# Patient Record
Sex: Female | Born: 1935 | Race: Black or African American | Hispanic: No
Health system: Southern US, Community
[De-identification: ages and names within clinical notes are randomized; demographics above are authoritative.]

---

## 2015-10-30 DIAGNOSIS — Z6831 Body mass index (BMI) 31.0-31.9, adult: Secondary | ICD-10-CM | POA: Diagnosis not present

## 2015-10-30 DIAGNOSIS — M810 Age-related osteoporosis without current pathological fracture: Secondary | ICD-10-CM | POA: Diagnosis not present

## 2015-10-30 DIAGNOSIS — I10 Essential (primary) hypertension: Secondary | ICD-10-CM | POA: Diagnosis not present

## 2015-11-04 DIAGNOSIS — M79671 Pain in right foot: Secondary | ICD-10-CM | POA: Diagnosis not present

## 2015-11-04 DIAGNOSIS — M76891 Other specified enthesopathies of right lower limb, excluding foot: Secondary | ICD-10-CM | POA: Diagnosis not present

## 2015-12-07 DIAGNOSIS — M79671 Pain in right foot: Secondary | ICD-10-CM | POA: Diagnosis not present

## 2015-12-07 DIAGNOSIS — G5791 Unspecified mononeuropathy of right lower limb: Secondary | ICD-10-CM | POA: Diagnosis not present

## 2016-02-02 DIAGNOSIS — I10 Essential (primary) hypertension: Secondary | ICD-10-CM | POA: Diagnosis not present

## 2016-02-02 DIAGNOSIS — R5383 Other fatigue: Secondary | ICD-10-CM | POA: Diagnosis not present

## 2016-02-02 DIAGNOSIS — M810 Age-related osteoporosis without current pathological fracture: Secondary | ICD-10-CM | POA: Diagnosis not present

## 2016-02-02 DIAGNOSIS — Z6831 Body mass index (BMI) 31.0-31.9, adult: Secondary | ICD-10-CM | POA: Diagnosis not present

## 2016-02-03 DIAGNOSIS — G4730 Sleep apnea, unspecified: Secondary | ICD-10-CM | POA: Diagnosis not present

## 2016-02-04 DIAGNOSIS — G4730 Sleep apnea, unspecified: Secondary | ICD-10-CM | POA: Diagnosis not present

## 2016-02-05 DIAGNOSIS — G4730 Sleep apnea, unspecified: Secondary | ICD-10-CM | POA: Diagnosis not present

## 2016-03-18 DIAGNOSIS — G5791 Unspecified mononeuropathy of right lower limb: Secondary | ICD-10-CM | POA: Diagnosis not present

## 2016-03-18 DIAGNOSIS — M79671 Pain in right foot: Secondary | ICD-10-CM | POA: Diagnosis not present

## 2016-05-09 DIAGNOSIS — M810 Age-related osteoporosis without current pathological fracture: Secondary | ICD-10-CM | POA: Diagnosis not present

## 2016-05-09 DIAGNOSIS — M25562 Pain in left knee: Secondary | ICD-10-CM | POA: Diagnosis not present

## 2016-05-09 DIAGNOSIS — M175 Other unilateral secondary osteoarthritis of knee: Secondary | ICD-10-CM | POA: Diagnosis not present

## 2016-05-09 DIAGNOSIS — Z6830 Body mass index (BMI) 30.0-30.9, adult: Secondary | ICD-10-CM | POA: Diagnosis not present

## 2016-05-09 DIAGNOSIS — I10 Essential (primary) hypertension: Secondary | ICD-10-CM | POA: Diagnosis not present

## 2016-08-11 DIAGNOSIS — Z1389 Encounter for screening for other disorder: Secondary | ICD-10-CM | POA: Diagnosis not present

## 2016-08-11 DIAGNOSIS — I10 Essential (primary) hypertension: Secondary | ICD-10-CM | POA: Diagnosis not present

## 2016-08-11 DIAGNOSIS — M25562 Pain in left knee: Secondary | ICD-10-CM | POA: Diagnosis not present

## 2016-08-11 DIAGNOSIS — M175 Other unilateral secondary osteoarthritis of knee: Secondary | ICD-10-CM | POA: Diagnosis not present

## 2016-08-11 DIAGNOSIS — Z6830 Body mass index (BMI) 30.0-30.9, adult: Secondary | ICD-10-CM | POA: Diagnosis not present

## 2016-08-11 DIAGNOSIS — M810 Age-related osteoporosis without current pathological fracture: Secondary | ICD-10-CM | POA: Diagnosis not present

## 2016-08-11 DIAGNOSIS — Z0000 Encounter for general adult medical examination without abnormal findings: Secondary | ICD-10-CM | POA: Diagnosis not present

## 2016-11-08 DIAGNOSIS — H2513 Age-related nuclear cataract, bilateral: Secondary | ICD-10-CM | POA: Diagnosis not present

## 2016-11-08 DIAGNOSIS — H25012 Cortical age-related cataract, left eye: Secondary | ICD-10-CM | POA: Diagnosis not present

## 2016-11-17 DIAGNOSIS — F411 Generalized anxiety disorder: Secondary | ICD-10-CM | POA: Diagnosis not present

## 2016-11-17 DIAGNOSIS — M175 Other unilateral secondary osteoarthritis of knee: Secondary | ICD-10-CM | POA: Diagnosis not present

## 2016-11-17 DIAGNOSIS — K210 Gastro-esophageal reflux disease with esophagitis: Secondary | ICD-10-CM | POA: Diagnosis not present

## 2016-11-17 DIAGNOSIS — M25562 Pain in left knee: Secondary | ICD-10-CM | POA: Diagnosis not present

## 2016-11-17 DIAGNOSIS — I10 Essential (primary) hypertension: Secondary | ICD-10-CM | POA: Diagnosis not present

## 2016-11-17 DIAGNOSIS — M810 Age-related osteoporosis without current pathological fracture: Secondary | ICD-10-CM | POA: Diagnosis not present

## 2016-11-17 DIAGNOSIS — Z6831 Body mass index (BMI) 31.0-31.9, adult: Secondary | ICD-10-CM | POA: Diagnosis not present

## 2016-11-17 DIAGNOSIS — Z0000 Encounter for general adult medical examination without abnormal findings: Secondary | ICD-10-CM | POA: Diagnosis not present

## 2016-11-17 DIAGNOSIS — Z13220 Encounter for screening for lipoid disorders: Secondary | ICD-10-CM | POA: Diagnosis not present

## 2016-12-20 DIAGNOSIS — N390 Urinary tract infection, site not specified: Secondary | ICD-10-CM | POA: Diagnosis not present

## 2017-02-17 DIAGNOSIS — M175 Other unilateral secondary osteoarthritis of knee: Secondary | ICD-10-CM | POA: Diagnosis not present

## 2017-02-17 DIAGNOSIS — M810 Age-related osteoporosis without current pathological fracture: Secondary | ICD-10-CM | POA: Diagnosis not present

## 2017-02-17 DIAGNOSIS — K210 Gastro-esophageal reflux disease with esophagitis: Secondary | ICD-10-CM | POA: Diagnosis not present

## 2017-02-17 DIAGNOSIS — M25562 Pain in left knee: Secondary | ICD-10-CM | POA: Diagnosis not present

## 2017-02-17 DIAGNOSIS — F411 Generalized anxiety disorder: Secondary | ICD-10-CM | POA: Diagnosis not present

## 2017-02-17 DIAGNOSIS — Z6831 Body mass index (BMI) 31.0-31.9, adult: Secondary | ICD-10-CM | POA: Diagnosis not present

## 2017-02-17 DIAGNOSIS — I10 Essential (primary) hypertension: Secondary | ICD-10-CM | POA: Diagnosis not present

## 2017-05-29 DIAGNOSIS — M25562 Pain in left knee: Secondary | ICD-10-CM | POA: Diagnosis not present

## 2017-05-29 DIAGNOSIS — F411 Generalized anxiety disorder: Secondary | ICD-10-CM | POA: Diagnosis not present

## 2017-05-29 DIAGNOSIS — K210 Gastro-esophageal reflux disease with esophagitis: Secondary | ICD-10-CM | POA: Diagnosis not present

## 2017-05-29 DIAGNOSIS — Z6831 Body mass index (BMI) 31.0-31.9, adult: Secondary | ICD-10-CM | POA: Diagnosis not present

## 2017-05-29 DIAGNOSIS — M175 Other unilateral secondary osteoarthritis of knee: Secondary | ICD-10-CM | POA: Diagnosis not present

## 2017-05-29 DIAGNOSIS — M810 Age-related osteoporosis without current pathological fracture: Secondary | ICD-10-CM | POA: Diagnosis not present

## 2017-05-29 DIAGNOSIS — I10 Essential (primary) hypertension: Secondary | ICD-10-CM | POA: Diagnosis not present

## 2017-05-31 DIAGNOSIS — N390 Urinary tract infection, site not specified: Secondary | ICD-10-CM | POA: Diagnosis not present

## 2017-08-31 DIAGNOSIS — M25562 Pain in left knee: Secondary | ICD-10-CM | POA: Diagnosis not present

## 2017-08-31 DIAGNOSIS — K210 Gastro-esophageal reflux disease with esophagitis: Secondary | ICD-10-CM | POA: Diagnosis not present

## 2017-08-31 DIAGNOSIS — Z6830 Body mass index (BMI) 30.0-30.9, adult: Secondary | ICD-10-CM | POA: Diagnosis not present

## 2017-08-31 DIAGNOSIS — I10 Essential (primary) hypertension: Secondary | ICD-10-CM | POA: Diagnosis not present

## 2017-08-31 DIAGNOSIS — M810 Age-related osteoporosis without current pathological fracture: Secondary | ICD-10-CM | POA: Diagnosis not present

## 2017-08-31 DIAGNOSIS — F411 Generalized anxiety disorder: Secondary | ICD-10-CM | POA: Diagnosis not present

## 2017-09-18 DIAGNOSIS — M150 Primary generalized (osteo)arthritis: Secondary | ICD-10-CM | POA: Diagnosis not present

## 2017-09-18 DIAGNOSIS — R5383 Other fatigue: Secondary | ICD-10-CM | POA: Diagnosis not present

## 2017-09-18 DIAGNOSIS — Z6829 Body mass index (BMI) 29.0-29.9, adult: Secondary | ICD-10-CM | POA: Diagnosis not present

## 2017-09-18 DIAGNOSIS — E663 Overweight: Secondary | ICD-10-CM | POA: Diagnosis not present

## 2017-09-18 DIAGNOSIS — M810 Age-related osteoporosis without current pathological fracture: Secondary | ICD-10-CM | POA: Diagnosis not present

## 2017-09-18 DIAGNOSIS — M2550 Pain in unspecified joint: Secondary | ICD-10-CM | POA: Diagnosis not present

## 2017-11-04 DIAGNOSIS — M7981 Nontraumatic hematoma of soft tissue: Secondary | ICD-10-CM | POA: Diagnosis not present

## 2017-11-08 DIAGNOSIS — E663 Overweight: Secondary | ICD-10-CM | POA: Diagnosis not present

## 2017-11-08 DIAGNOSIS — M810 Age-related osteoporosis without current pathological fracture: Secondary | ICD-10-CM | POA: Diagnosis not present

## 2017-11-08 DIAGNOSIS — Z6829 Body mass index (BMI) 29.0-29.9, adult: Secondary | ICD-10-CM | POA: Diagnosis not present

## 2017-11-08 DIAGNOSIS — M150 Primary generalized (osteo)arthritis: Secondary | ICD-10-CM | POA: Diagnosis not present

## 2017-11-14 DIAGNOSIS — M25552 Pain in left hip: Secondary | ICD-10-CM | POA: Diagnosis not present

## 2017-11-14 DIAGNOSIS — M150 Primary generalized (osteo)arthritis: Secondary | ICD-10-CM | POA: Diagnosis not present

## 2017-11-14 DIAGNOSIS — M25562 Pain in left knee: Secondary | ICD-10-CM | POA: Diagnosis not present

## 2017-11-14 DIAGNOSIS — M62552 Muscle wasting and atrophy, not elsewhere classified, left thigh: Secondary | ICD-10-CM | POA: Diagnosis not present

## 2017-11-16 DIAGNOSIS — M25552 Pain in left hip: Secondary | ICD-10-CM | POA: Diagnosis not present

## 2017-11-16 DIAGNOSIS — M62552 Muscle wasting and atrophy, not elsewhere classified, left thigh: Secondary | ICD-10-CM | POA: Diagnosis not present

## 2017-11-16 DIAGNOSIS — M25562 Pain in left knee: Secondary | ICD-10-CM | POA: Diagnosis not present

## 2017-11-16 DIAGNOSIS — M150 Primary generalized (osteo)arthritis: Secondary | ICD-10-CM | POA: Diagnosis not present

## 2017-12-15 DIAGNOSIS — M810 Age-related osteoporosis without current pathological fracture: Secondary | ICD-10-CM | POA: Diagnosis not present

## 2017-12-15 DIAGNOSIS — Z6830 Body mass index (BMI) 30.0-30.9, adult: Secondary | ICD-10-CM | POA: Diagnosis not present

## 2017-12-15 DIAGNOSIS — M25562 Pain in left knee: Secondary | ICD-10-CM | POA: Diagnosis not present

## 2017-12-15 DIAGNOSIS — K210 Gastro-esophageal reflux disease with esophagitis: Secondary | ICD-10-CM | POA: Diagnosis not present

## 2017-12-15 DIAGNOSIS — I10 Essential (primary) hypertension: Secondary | ICD-10-CM | POA: Diagnosis not present

## 2017-12-15 DIAGNOSIS — F411 Generalized anxiety disorder: Secondary | ICD-10-CM | POA: Diagnosis not present

## 2017-12-29 DIAGNOSIS — K210 Gastro-esophageal reflux disease with esophagitis: Secondary | ICD-10-CM | POA: Diagnosis not present

## 2017-12-29 DIAGNOSIS — I10 Essential (primary) hypertension: Secondary | ICD-10-CM | POA: Diagnosis not present

## 2017-12-29 DIAGNOSIS — M810 Age-related osteoporosis without current pathological fracture: Secondary | ICD-10-CM | POA: Diagnosis not present

## 2018-03-21 DIAGNOSIS — I10 Essential (primary) hypertension: Secondary | ICD-10-CM | POA: Diagnosis not present

## 2018-03-21 DIAGNOSIS — F411 Generalized anxiety disorder: Secondary | ICD-10-CM | POA: Diagnosis not present

## 2018-03-21 DIAGNOSIS — Z6829 Body mass index (BMI) 29.0-29.9, adult: Secondary | ICD-10-CM | POA: Diagnosis not present

## 2018-03-21 DIAGNOSIS — M25562 Pain in left knee: Secondary | ICD-10-CM | POA: Diagnosis not present

## 2018-03-21 DIAGNOSIS — H671 Otitis media in diseases classified elsewhere, right ear: Secondary | ICD-10-CM | POA: Diagnosis not present

## 2018-03-21 DIAGNOSIS — Z6830 Body mass index (BMI) 30.0-30.9, adult: Secondary | ICD-10-CM | POA: Diagnosis not present

## 2018-03-21 DIAGNOSIS — K210 Gastro-esophageal reflux disease with esophagitis: Secondary | ICD-10-CM | POA: Diagnosis not present

## 2018-03-21 DIAGNOSIS — M810 Age-related osteoporosis without current pathological fracture: Secondary | ICD-10-CM | POA: Diagnosis not present

## 2018-07-04 DIAGNOSIS — I10 Essential (primary) hypertension: Secondary | ICD-10-CM | POA: Diagnosis not present

## 2018-07-04 DIAGNOSIS — K210 Gastro-esophageal reflux disease with esophagitis: Secondary | ICD-10-CM | POA: Diagnosis not present

## 2018-07-04 DIAGNOSIS — Z1389 Encounter for screening for other disorder: Secondary | ICD-10-CM | POA: Diagnosis not present

## 2018-07-04 DIAGNOSIS — F411 Generalized anxiety disorder: Secondary | ICD-10-CM | POA: Diagnosis not present

## 2018-07-04 DIAGNOSIS — Z6827 Body mass index (BMI) 27.0-27.9, adult: Secondary | ICD-10-CM | POA: Diagnosis not present

## 2018-07-04 DIAGNOSIS — Z0000 Encounter for general adult medical examination without abnormal findings: Secondary | ICD-10-CM | POA: Diagnosis not present

## 2018-07-04 DIAGNOSIS — M810 Age-related osteoporosis without current pathological fracture: Secondary | ICD-10-CM | POA: Diagnosis not present

## 2018-08-09 DIAGNOSIS — M85852 Other specified disorders of bone density and structure, left thigh: Secondary | ICD-10-CM | POA: Diagnosis not present

## 2018-08-09 DIAGNOSIS — M810 Age-related osteoporosis without current pathological fracture: Secondary | ICD-10-CM | POA: Diagnosis not present

## 2018-09-18 DIAGNOSIS — I10 Essential (primary) hypertension: Secondary | ICD-10-CM | POA: Diagnosis not present

## 2018-09-18 DIAGNOSIS — H35372 Puckering of macula, left eye: Secondary | ICD-10-CM | POA: Diagnosis not present

## 2018-09-18 DIAGNOSIS — Z01818 Encounter for other preprocedural examination: Secondary | ICD-10-CM | POA: Diagnosis not present

## 2018-09-18 DIAGNOSIS — F411 Generalized anxiety disorder: Secondary | ICD-10-CM | POA: Diagnosis not present

## 2018-09-18 DIAGNOSIS — H25812 Combined forms of age-related cataract, left eye: Secondary | ICD-10-CM | POA: Diagnosis not present

## 2018-09-18 DIAGNOSIS — K210 Gastro-esophageal reflux disease with esophagitis: Secondary | ICD-10-CM | POA: Diagnosis not present

## 2018-09-18 DIAGNOSIS — H25811 Combined forms of age-related cataract, right eye: Secondary | ICD-10-CM | POA: Diagnosis not present

## 2018-10-08 DIAGNOSIS — H2512 Age-related nuclear cataract, left eye: Secondary | ICD-10-CM | POA: Diagnosis not present

## 2018-10-08 DIAGNOSIS — H25812 Combined forms of age-related cataract, left eye: Secondary | ICD-10-CM | POA: Diagnosis not present

## 2018-10-11 DIAGNOSIS — I10 Essential (primary) hypertension: Secondary | ICD-10-CM | POA: Diagnosis not present

## 2018-10-11 DIAGNOSIS — Z6826 Body mass index (BMI) 26.0-26.9, adult: Secondary | ICD-10-CM | POA: Diagnosis not present

## 2018-10-11 DIAGNOSIS — Z1389 Encounter for screening for other disorder: Secondary | ICD-10-CM | POA: Diagnosis not present

## 2018-10-11 DIAGNOSIS — Z0000 Encounter for general adult medical examination without abnormal findings: Secondary | ICD-10-CM | POA: Diagnosis not present

## 2018-12-13 DIAGNOSIS — H25811 Combined forms of age-related cataract, right eye: Secondary | ICD-10-CM | POA: Diagnosis not present

## 2018-12-13 DIAGNOSIS — H2511 Age-related nuclear cataract, right eye: Secondary | ICD-10-CM | POA: Diagnosis not present

## 2018-12-21 DIAGNOSIS — H2511 Age-related nuclear cataract, right eye: Secondary | ICD-10-CM | POA: Diagnosis not present

## 2019-03-20 DIAGNOSIS — M810 Age-related osteoporosis without current pathological fracture: Secondary | ICD-10-CM | POA: Diagnosis not present

## 2019-03-20 DIAGNOSIS — I10 Essential (primary) hypertension: Secondary | ICD-10-CM | POA: Diagnosis not present

## 2019-04-16 DIAGNOSIS — F419 Anxiety disorder, unspecified: Secondary | ICD-10-CM | POA: Diagnosis not present

## 2019-04-16 DIAGNOSIS — E785 Hyperlipidemia, unspecified: Secondary | ICD-10-CM | POA: Diagnosis not present

## 2019-04-16 DIAGNOSIS — Z6826 Body mass index (BMI) 26.0-26.9, adult: Secondary | ICD-10-CM | POA: Diagnosis not present

## 2019-04-16 DIAGNOSIS — I10 Essential (primary) hypertension: Secondary | ICD-10-CM | POA: Diagnosis not present

## 2019-05-31 DIAGNOSIS — N390 Urinary tract infection, site not specified: Secondary | ICD-10-CM | POA: Diagnosis not present

## 2019-06-21 DIAGNOSIS — N958 Other specified menopausal and perimenopausal disorders: Secondary | ICD-10-CM | POA: Diagnosis not present

## 2019-06-21 DIAGNOSIS — N760 Acute vaginitis: Secondary | ICD-10-CM | POA: Diagnosis not present

## 2019-07-18 DIAGNOSIS — Z1389 Encounter for screening for other disorder: Secondary | ICD-10-CM | POA: Diagnosis not present

## 2019-07-18 DIAGNOSIS — M25562 Pain in left knee: Secondary | ICD-10-CM | POA: Diagnosis not present

## 2019-07-18 DIAGNOSIS — I10 Essential (primary) hypertension: Secondary | ICD-10-CM | POA: Diagnosis not present

## 2019-07-18 DIAGNOSIS — Z0000 Encounter for general adult medical examination without abnormal findings: Secondary | ICD-10-CM | POA: Diagnosis not present

## 2019-07-18 DIAGNOSIS — E782 Mixed hyperlipidemia: Secondary | ICD-10-CM | POA: Diagnosis not present

## 2019-07-18 DIAGNOSIS — Z6827 Body mass index (BMI) 27.0-27.9, adult: Secondary | ICD-10-CM | POA: Diagnosis not present

## 2019-07-18 DIAGNOSIS — F418 Other specified anxiety disorders: Secondary | ICD-10-CM | POA: Diagnosis not present

## 2019-09-13 DIAGNOSIS — M79676 Pain in unspecified toe(s): Secondary | ICD-10-CM | POA: Diagnosis not present

## 2019-09-13 DIAGNOSIS — B351 Tinea unguium: Secondary | ICD-10-CM | POA: Diagnosis not present

## 2019-10-28 DIAGNOSIS — Z6826 Body mass index (BMI) 26.0-26.9, adult: Secondary | ICD-10-CM | POA: Diagnosis not present

## 2019-10-28 DIAGNOSIS — I10 Essential (primary) hypertension: Secondary | ICD-10-CM | POA: Diagnosis not present

## 2019-10-28 DIAGNOSIS — E782 Mixed hyperlipidemia: Secondary | ICD-10-CM | POA: Diagnosis not present

## 2019-10-28 DIAGNOSIS — M25562 Pain in left knee: Secondary | ICD-10-CM | POA: Diagnosis not present

## 2019-10-28 DIAGNOSIS — F418 Other specified anxiety disorders: Secondary | ICD-10-CM | POA: Diagnosis not present

## 2019-10-28 DIAGNOSIS — M5432 Sciatica, left side: Secondary | ICD-10-CM | POA: Diagnosis not present

## 2019-11-08 DIAGNOSIS — M5432 Sciatica, left side: Secondary | ICD-10-CM | POA: Diagnosis not present

## 2019-11-08 DIAGNOSIS — M47816 Spondylosis without myelopathy or radiculopathy, lumbar region: Secondary | ICD-10-CM | POA: Diagnosis not present

## 2019-11-08 DIAGNOSIS — M545 Low back pain: Secondary | ICD-10-CM | POA: Diagnosis not present

## 2019-11-08 DIAGNOSIS — M48061 Spinal stenosis, lumbar region without neurogenic claudication: Secondary | ICD-10-CM | POA: Diagnosis not present

## 2019-11-13 ENCOUNTER — Other Ambulatory Visit: Payer: Self-pay | Admitting: Internal Medicine

## 2019-11-13 DIAGNOSIS — G8929 Other chronic pain: Secondary | ICD-10-CM

## 2019-11-20 ENCOUNTER — Other Ambulatory Visit: Payer: Self-pay

## 2019-11-20 ENCOUNTER — Ambulatory Visit
Admission: RE | Admit: 2019-11-20 | Discharge: 2019-11-20 | Disposition: A | Discharge status: Home / Self Care | Payer: Medicare PPO | Source: Ambulatory Visit | Attending: Internal Medicine | Admitting: Internal Medicine

## 2019-11-20 DIAGNOSIS — G8929 Other chronic pain: Secondary | ICD-10-CM

## 2019-11-20 DIAGNOSIS — M545 Low back pain, unspecified: Secondary | ICD-10-CM

## 2019-11-20 MED ORDER — METHYLPREDNISOLONE ACETATE 40 MG/ML INJ SUSP (RADIOLOG
120.0000 mg | Freq: Once | INTRAMUSCULAR | Status: AC
  Administered 2019-11-20: 120 mg via EPIDURAL

## 2019-11-20 MED ORDER — IOPAMIDOL (ISOVUE-M 200) INJECTION 41%
1.0000 mL | Freq: Once | INTRAMUSCULAR | Status: AC
  Administered 2019-11-20: 1 mL via EPIDURAL

## 2019-11-20 NOTE — Discharge Instructions (Signed)

## 2020-01-27 DIAGNOSIS — M2550 Pain in unspecified joint: Secondary | ICD-10-CM | POA: Diagnosis not present

## 2020-01-27 DIAGNOSIS — E782 Mixed hyperlipidemia: Secondary | ICD-10-CM | POA: Diagnosis not present

## 2020-01-27 DIAGNOSIS — Z0000 Encounter for general adult medical examination without abnormal findings: Secondary | ICD-10-CM | POA: Diagnosis not present

## 2020-01-27 DIAGNOSIS — M25562 Pain in left knee: Secondary | ICD-10-CM | POA: Diagnosis not present

## 2020-01-27 DIAGNOSIS — F418 Other specified anxiety disorders: Secondary | ICD-10-CM | POA: Diagnosis not present

## 2020-01-27 DIAGNOSIS — I10 Essential (primary) hypertension: Secondary | ICD-10-CM | POA: Diagnosis not present

## 2020-01-27 DIAGNOSIS — M5432 Sciatica, left side: Secondary | ICD-10-CM | POA: Diagnosis not present

## 2020-01-27 DIAGNOSIS — Z6827 Body mass index (BMI) 27.0-27.9, adult: Secondary | ICD-10-CM | POA: Diagnosis not present

## 2020-03-24 DIAGNOSIS — Z6827 Body mass index (BMI) 27.0-27.9, adult: Secondary | ICD-10-CM | POA: Diagnosis not present

## 2020-03-24 DIAGNOSIS — N952 Postmenopausal atrophic vaginitis: Secondary | ICD-10-CM | POA: Diagnosis not present

## 2020-03-24 DIAGNOSIS — N905 Atrophy of vulva: Secondary | ICD-10-CM | POA: Diagnosis not present

## 2020-03-24 DIAGNOSIS — I10 Essential (primary) hypertension: Secondary | ICD-10-CM | POA: Diagnosis not present

## 2020-04-01 DIAGNOSIS — H903 Sensorineural hearing loss, bilateral: Secondary | ICD-10-CM | POA: Diagnosis not present

## 2020-04-01 DIAGNOSIS — H9313 Tinnitus, bilateral: Secondary | ICD-10-CM | POA: Diagnosis not present

## 2020-04-04 IMAGING — XA Imaging study
2 series · 2 of 2 positions shown · non-contrast
Comparison: none

CLINICAL DATA: Lumbosacral spondylosis without myelopathy. Left low
back, left hip, and lateral left leg pain. Widespread lumbar disc
and facet degeneration with multilevel spinal and neural foraminal
stenosis.

[Series 1: ortho standard · 1 of 1 slices shown (1 of 2)]
[im 1/1]
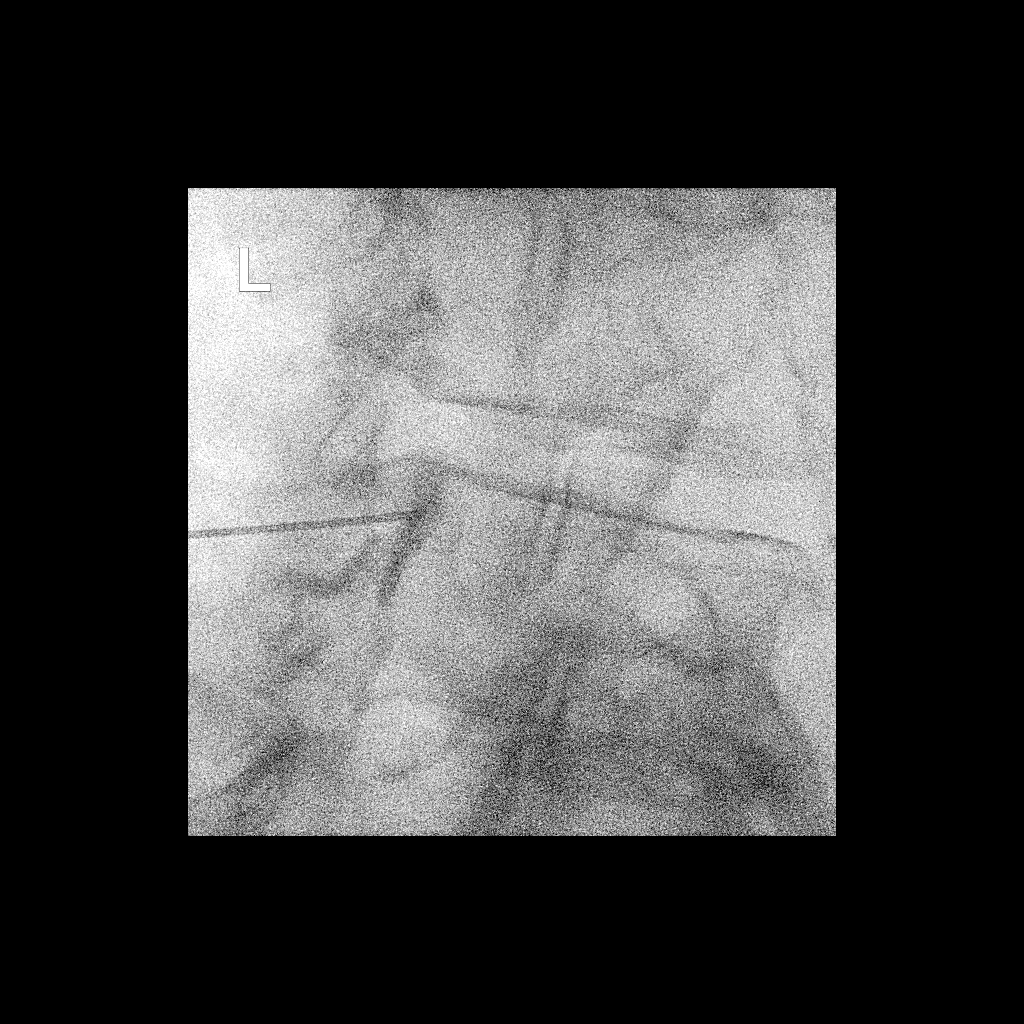

[Series 2: ortho standard · 1 of 1 slices shown (2 of 2)]
[im 1/1]
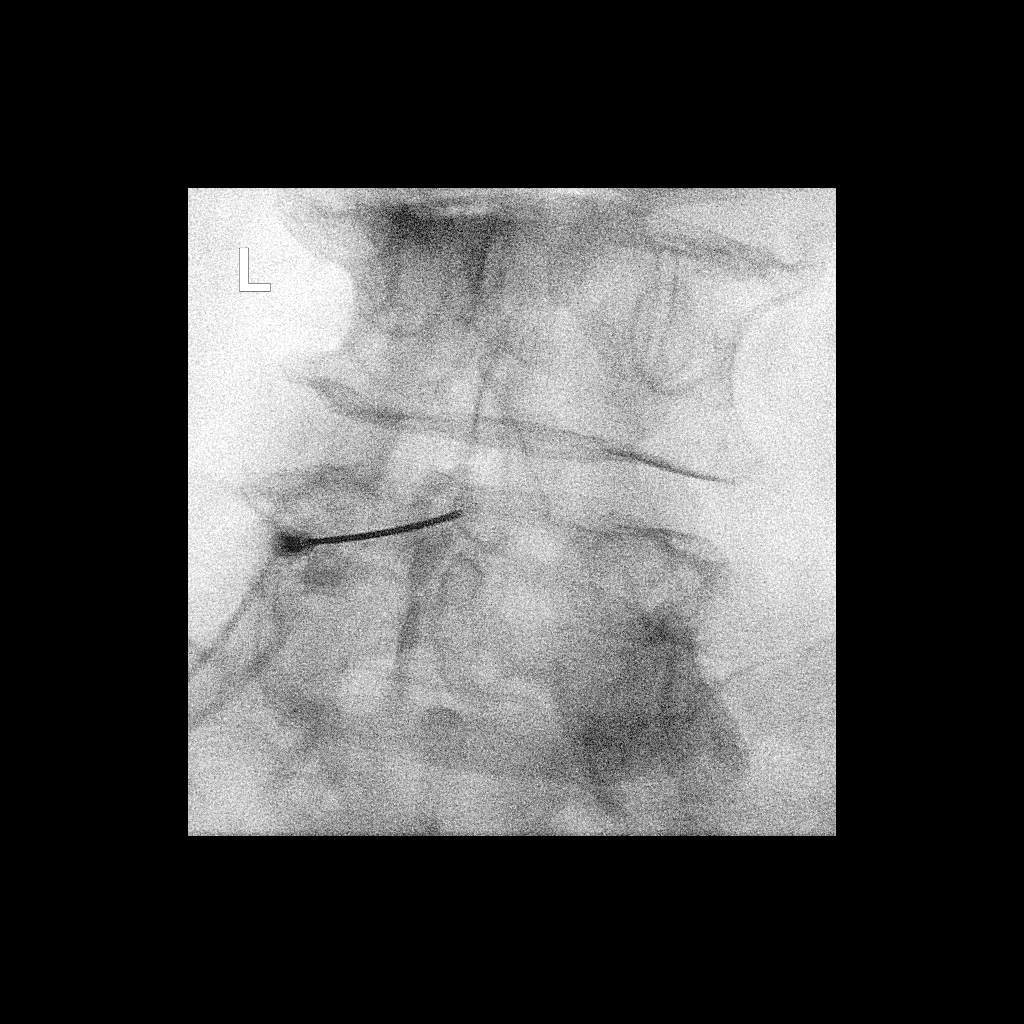

[2 of 2 positions shown; findings below may reference images not displayed]

FLUOROSCOPY TIME:  Fluoroscopy Time: 19 seconds

Radiation Exposure Index: 13.52 microGray*m^2

PROCEDURE:
The procedure, risks, benefits, and alternatives were explained to
the patient. Questions regarding the procedure were encouraged and
answered. The patient understands and consents to the procedure.

LUMBAR EPIDURAL INJECTION:

An interlaminar approach was performed on the left at L4-5. The
overlying skin was cleansed and anesthetized. A 3.5 inch 20 gauge
epidural needle was advanced using loss-of-resistance technique.

DIAGNOSTIC EPIDURAL INJECTION:

Injection of Isovue-M 200 shows a good epidural pattern with spread
above and below the level of needle placement, primarily on the
left. No vascular opacification is seen.

THERAPEUTIC EPIDURAL INJECTION:

120 mg of Depo-Medrol mixed with 3 mL of 1% lidocaine were
instilled. The procedure was well-tolerated, and the patient was
discharged thirty minutes following the injection in good condition.

COMPLICATIONS:
None
IMPRESSION: Technically successful interlaminar epidural injection on the left
at L4-5.

## 2020-04-14 DIAGNOSIS — Z6825 Body mass index (BMI) 25.0-25.9, adult: Secondary | ICD-10-CM | POA: Diagnosis not present

## 2020-04-14 DIAGNOSIS — I10 Essential (primary) hypertension: Secondary | ICD-10-CM | POA: Diagnosis not present

## 2020-05-06 DIAGNOSIS — I10 Essential (primary) hypertension: Secondary | ICD-10-CM | POA: Diagnosis not present

## 2020-05-06 DIAGNOSIS — R5383 Other fatigue: Secondary | ICD-10-CM | POA: Diagnosis not present

## 2020-05-06 DIAGNOSIS — Z6825 Body mass index (BMI) 25.0-25.9, adult: Secondary | ICD-10-CM | POA: Diagnosis not present

## 2020-05-25 DIAGNOSIS — K909 Intestinal malabsorption, unspecified: Secondary | ICD-10-CM | POA: Diagnosis not present

## 2020-05-25 DIAGNOSIS — E611 Iron deficiency: Secondary | ICD-10-CM | POA: Diagnosis not present

## 2020-05-25 DIAGNOSIS — R5383 Other fatigue: Secondary | ICD-10-CM | POA: Diagnosis not present

## 2020-05-25 DIAGNOSIS — R198 Other specified symptoms and signs involving the digestive system and abdomen: Secondary | ICD-10-CM | POA: Diagnosis not present

## 2020-06-09 DIAGNOSIS — Z113 Encounter for screening for infections with a predominantly sexual mode of transmission: Secondary | ICD-10-CM | POA: Diagnosis not present

## 2020-06-09 DIAGNOSIS — N760 Acute vaginitis: Secondary | ICD-10-CM | POA: Diagnosis not present

## 2020-06-09 DIAGNOSIS — N952 Postmenopausal atrophic vaginitis: Secondary | ICD-10-CM | POA: Diagnosis not present

## 2020-06-15 DIAGNOSIS — E210 Primary hyperparathyroidism: Secondary | ICD-10-CM | POA: Diagnosis not present

## 2020-06-15 DIAGNOSIS — R5383 Other fatigue: Secondary | ICD-10-CM | POA: Diagnosis not present

## 2020-06-16 DIAGNOSIS — R5383 Other fatigue: Secondary | ICD-10-CM | POA: Diagnosis not present

## 2020-07-03 DIAGNOSIS — E210 Primary hyperparathyroidism: Secondary | ICD-10-CM | POA: Diagnosis not present

## 2020-07-03 DIAGNOSIS — E039 Hypothyroidism, unspecified: Secondary | ICD-10-CM | POA: Diagnosis not present

## 2020-07-13 DIAGNOSIS — N761 Subacute and chronic vaginitis: Secondary | ICD-10-CM | POA: Diagnosis not present

## 2020-07-13 DIAGNOSIS — Z113 Encounter for screening for infections with a predominantly sexual mode of transmission: Secondary | ICD-10-CM | POA: Diagnosis not present

## 2020-08-10 DIAGNOSIS — I10 Essential (primary) hypertension: Secondary | ICD-10-CM | POA: Diagnosis not present

## 2020-08-10 DIAGNOSIS — Z1331 Encounter for screening for depression: Secondary | ICD-10-CM | POA: Diagnosis not present

## 2020-08-10 DIAGNOSIS — Z6825 Body mass index (BMI) 25.0-25.9, adult: Secondary | ICD-10-CM | POA: Diagnosis not present

## 2020-08-10 DIAGNOSIS — Z0000 Encounter for general adult medical examination without abnormal findings: Secondary | ICD-10-CM | POA: Diagnosis not present

## 2020-08-10 DIAGNOSIS — R5383 Other fatigue: Secondary | ICD-10-CM | POA: Diagnosis not present

## 2020-08-10 DIAGNOSIS — F411 Generalized anxiety disorder: Secondary | ICD-10-CM | POA: Diagnosis not present

## 2020-08-27 DIAGNOSIS — N951 Menopausal and female climacteric states: Secondary | ICD-10-CM | POA: Diagnosis not present

## 2020-08-27 DIAGNOSIS — Z719 Counseling, unspecified: Secondary | ICD-10-CM | POA: Diagnosis not present

## 2020-08-27 DIAGNOSIS — Z2821 Immunization not carried out because of patient refusal: Secondary | ICD-10-CM | POA: Diagnosis not present

## 2020-08-27 DIAGNOSIS — N898 Other specified noninflammatory disorders of vagina: Secondary | ICD-10-CM | POA: Diagnosis not present

## 2020-09-30 DIAGNOSIS — M533 Sacrococcygeal disorders, not elsewhere classified: Secondary | ICD-10-CM | POA: Diagnosis not present

## 2020-09-30 DIAGNOSIS — M7062 Trochanteric bursitis, left hip: Secondary | ICD-10-CM | POA: Diagnosis not present

## 2020-09-30 DIAGNOSIS — M79606 Pain in leg, unspecified: Secondary | ICD-10-CM | POA: Diagnosis not present

## 2020-09-30 DIAGNOSIS — M48062 Spinal stenosis, lumbar region with neurogenic claudication: Secondary | ICD-10-CM | POA: Diagnosis not present

## 2020-10-13 DIAGNOSIS — E559 Vitamin D deficiency, unspecified: Secondary | ICD-10-CM | POA: Diagnosis not present

## 2020-10-13 DIAGNOSIS — R5383 Other fatigue: Secondary | ICD-10-CM | POA: Diagnosis not present

## 2020-10-22 DIAGNOSIS — M7062 Trochanteric bursitis, left hip: Secondary | ICD-10-CM | POA: Diagnosis not present

## 2020-10-22 DIAGNOSIS — M533 Sacrococcygeal disorders, not elsewhere classified: Secondary | ICD-10-CM | POA: Diagnosis not present

## 2020-10-29 DIAGNOSIS — M6281 Muscle weakness (generalized): Secondary | ICD-10-CM | POA: Diagnosis not present

## 2020-10-29 DIAGNOSIS — R262 Difficulty in walking, not elsewhere classified: Secondary | ICD-10-CM | POA: Diagnosis not present

## 2020-10-29 DIAGNOSIS — M48062 Spinal stenosis, lumbar region with neurogenic claudication: Secondary | ICD-10-CM | POA: Diagnosis not present

## 2020-11-02 DIAGNOSIS — R262 Difficulty in walking, not elsewhere classified: Secondary | ICD-10-CM | POA: Diagnosis not present

## 2020-11-02 DIAGNOSIS — M6281 Muscle weakness (generalized): Secondary | ICD-10-CM | POA: Diagnosis not present

## 2020-11-02 DIAGNOSIS — M48062 Spinal stenosis, lumbar region with neurogenic claudication: Secondary | ICD-10-CM | POA: Diagnosis not present

## 2020-11-04 DIAGNOSIS — M6281 Muscle weakness (generalized): Secondary | ICD-10-CM | POA: Diagnosis not present

## 2020-11-04 DIAGNOSIS — R262 Difficulty in walking, not elsewhere classified: Secondary | ICD-10-CM | POA: Diagnosis not present

## 2020-11-04 DIAGNOSIS — M48062 Spinal stenosis, lumbar region with neurogenic claudication: Secondary | ICD-10-CM | POA: Diagnosis not present

## 2020-11-11 DIAGNOSIS — M48062 Spinal stenosis, lumbar region with neurogenic claudication: Secondary | ICD-10-CM | POA: Diagnosis not present

## 2020-11-11 DIAGNOSIS — M6281 Muscle weakness (generalized): Secondary | ICD-10-CM | POA: Diagnosis not present

## 2020-11-11 DIAGNOSIS — R262 Difficulty in walking, not elsewhere classified: Secondary | ICD-10-CM | POA: Diagnosis not present

## 2020-11-16 DIAGNOSIS — M48062 Spinal stenosis, lumbar region with neurogenic claudication: Secondary | ICD-10-CM | POA: Diagnosis not present

## 2020-11-16 DIAGNOSIS — R262 Difficulty in walking, not elsewhere classified: Secondary | ICD-10-CM | POA: Diagnosis not present

## 2020-11-16 DIAGNOSIS — M6281 Muscle weakness (generalized): Secondary | ICD-10-CM | POA: Diagnosis not present

## 2020-11-18 DIAGNOSIS — M48062 Spinal stenosis, lumbar region with neurogenic claudication: Secondary | ICD-10-CM | POA: Diagnosis not present

## 2020-11-18 DIAGNOSIS — R262 Difficulty in walking, not elsewhere classified: Secondary | ICD-10-CM | POA: Diagnosis not present

## 2020-11-18 DIAGNOSIS — M6281 Muscle weakness (generalized): Secondary | ICD-10-CM | POA: Diagnosis not present

## 2020-11-23 DIAGNOSIS — M6281 Muscle weakness (generalized): Secondary | ICD-10-CM | POA: Diagnosis not present

## 2020-11-23 DIAGNOSIS — R262 Difficulty in walking, not elsewhere classified: Secondary | ICD-10-CM | POA: Diagnosis not present

## 2020-11-23 DIAGNOSIS — M48062 Spinal stenosis, lumbar region with neurogenic claudication: Secondary | ICD-10-CM | POA: Diagnosis not present

## 2020-11-25 DIAGNOSIS — R262 Difficulty in walking, not elsewhere classified: Secondary | ICD-10-CM | POA: Diagnosis not present

## 2020-11-25 DIAGNOSIS — M48062 Spinal stenosis, lumbar region with neurogenic claudication: Secondary | ICD-10-CM | POA: Diagnosis not present

## 2020-11-25 DIAGNOSIS — M6281 Muscle weakness (generalized): Secondary | ICD-10-CM | POA: Diagnosis not present

## 2020-11-26 DIAGNOSIS — M7062 Trochanteric bursitis, left hip: Secondary | ICD-10-CM | POA: Diagnosis not present

## 2020-11-26 DIAGNOSIS — M533 Sacrococcygeal disorders, not elsewhere classified: Secondary | ICD-10-CM | POA: Diagnosis not present

## 2020-11-30 DIAGNOSIS — M48062 Spinal stenosis, lumbar region with neurogenic claudication: Secondary | ICD-10-CM | POA: Diagnosis not present

## 2020-11-30 DIAGNOSIS — R262 Difficulty in walking, not elsewhere classified: Secondary | ICD-10-CM | POA: Diagnosis not present

## 2020-11-30 DIAGNOSIS — M6281 Muscle weakness (generalized): Secondary | ICD-10-CM | POA: Diagnosis not present

## 2020-12-01 DIAGNOSIS — I10 Essential (primary) hypertension: Secondary | ICD-10-CM | POA: Diagnosis not present

## 2020-12-01 DIAGNOSIS — Z6825 Body mass index (BMI) 25.0-25.9, adult: Secondary | ICD-10-CM | POA: Diagnosis not present

## 2020-12-01 DIAGNOSIS — F411 Generalized anxiety disorder: Secondary | ICD-10-CM | POA: Diagnosis not present

## 2020-12-01 DIAGNOSIS — F330 Major depressive disorder, recurrent, mild: Secondary | ICD-10-CM | POA: Diagnosis not present

## 2020-12-02 DIAGNOSIS — R262 Difficulty in walking, not elsewhere classified: Secondary | ICD-10-CM | POA: Diagnosis not present

## 2020-12-02 DIAGNOSIS — M6281 Muscle weakness (generalized): Secondary | ICD-10-CM | POA: Diagnosis not present

## 2020-12-02 DIAGNOSIS — M48062 Spinal stenosis, lumbar region with neurogenic claudication: Secondary | ICD-10-CM | POA: Diagnosis not present

## 2020-12-07 DIAGNOSIS — M6281 Muscle weakness (generalized): Secondary | ICD-10-CM | POA: Diagnosis not present

## 2020-12-07 DIAGNOSIS — M48062 Spinal stenosis, lumbar region with neurogenic claudication: Secondary | ICD-10-CM | POA: Diagnosis not present

## 2020-12-07 DIAGNOSIS — R262 Difficulty in walking, not elsewhere classified: Secondary | ICD-10-CM | POA: Diagnosis not present

## 2020-12-09 DIAGNOSIS — R262 Difficulty in walking, not elsewhere classified: Secondary | ICD-10-CM | POA: Diagnosis not present

## 2020-12-09 DIAGNOSIS — M48062 Spinal stenosis, lumbar region with neurogenic claudication: Secondary | ICD-10-CM | POA: Diagnosis not present

## 2020-12-09 DIAGNOSIS — M6281 Muscle weakness (generalized): Secondary | ICD-10-CM | POA: Diagnosis not present

## 2020-12-14 DIAGNOSIS — R262 Difficulty in walking, not elsewhere classified: Secondary | ICD-10-CM | POA: Diagnosis not present

## 2020-12-14 DIAGNOSIS — M48062 Spinal stenosis, lumbar region with neurogenic claudication: Secondary | ICD-10-CM | POA: Diagnosis not present

## 2020-12-14 DIAGNOSIS — M6281 Muscle weakness (generalized): Secondary | ICD-10-CM | POA: Diagnosis not present

## 2020-12-16 DIAGNOSIS — M6281 Muscle weakness (generalized): Secondary | ICD-10-CM | POA: Diagnosis not present

## 2020-12-16 DIAGNOSIS — R262 Difficulty in walking, not elsewhere classified: Secondary | ICD-10-CM | POA: Diagnosis not present

## 2020-12-16 DIAGNOSIS — M48062 Spinal stenosis, lumbar region with neurogenic claudication: Secondary | ICD-10-CM | POA: Diagnosis not present

## 2021-02-01 DIAGNOSIS — E611 Iron deficiency: Secondary | ICD-10-CM | POA: Diagnosis not present

## 2021-02-01 DIAGNOSIS — E559 Vitamin D deficiency, unspecified: Secondary | ICD-10-CM | POA: Diagnosis not present

## 2021-02-01 DIAGNOSIS — R5383 Other fatigue: Secondary | ICD-10-CM | POA: Diagnosis not present

## 2021-02-23 DIAGNOSIS — M1712 Unilateral primary osteoarthritis, left knee: Secondary | ICD-10-CM | POA: Diagnosis not present

## 2021-02-23 DIAGNOSIS — G8929 Other chronic pain: Secondary | ICD-10-CM | POA: Diagnosis not present

## 2021-02-23 DIAGNOSIS — M25562 Pain in left knee: Secondary | ICD-10-CM | POA: Diagnosis not present

## 2021-02-23 DIAGNOSIS — M533 Sacrococcygeal disorders, not elsewhere classified: Secondary | ICD-10-CM | POA: Diagnosis not present

## 2021-02-23 DIAGNOSIS — M7062 Trochanteric bursitis, left hip: Secondary | ICD-10-CM | POA: Diagnosis not present

## 2021-03-04 DIAGNOSIS — F411 Generalized anxiety disorder: Secondary | ICD-10-CM | POA: Diagnosis not present

## 2021-03-04 DIAGNOSIS — Z6826 Body mass index (BMI) 26.0-26.9, adult: Secondary | ICD-10-CM | POA: Diagnosis not present

## 2021-03-04 DIAGNOSIS — I10 Essential (primary) hypertension: Secondary | ICD-10-CM | POA: Diagnosis not present

## 2021-03-04 DIAGNOSIS — Z0000 Encounter for general adult medical examination without abnormal findings: Secondary | ICD-10-CM | POA: Diagnosis not present

## 2021-03-04 DIAGNOSIS — F330 Major depressive disorder, recurrent, mild: Secondary | ICD-10-CM | POA: Diagnosis not present

## 2021-03-08 DIAGNOSIS — M85851 Other specified disorders of bone density and structure, right thigh: Secondary | ICD-10-CM | POA: Diagnosis not present

## 2021-03-08 DIAGNOSIS — E2839 Other primary ovarian failure: Secondary | ICD-10-CM | POA: Diagnosis not present

## 2021-03-08 DIAGNOSIS — M810 Age-related osteoporosis without current pathological fracture: Secondary | ICD-10-CM | POA: Diagnosis not present

## 2021-03-22 DIAGNOSIS — F411 Generalized anxiety disorder: Secondary | ICD-10-CM | POA: Diagnosis not present

## 2021-03-22 DIAGNOSIS — I10 Essential (primary) hypertension: Secondary | ICD-10-CM | POA: Diagnosis not present

## 2021-03-22 DIAGNOSIS — F330 Major depressive disorder, recurrent, mild: Secondary | ICD-10-CM | POA: Diagnosis not present

## 2021-05-25 DIAGNOSIS — M25562 Pain in left knee: Secondary | ICD-10-CM | POA: Diagnosis not present

## 2021-06-09 DIAGNOSIS — Z6825 Body mass index (BMI) 25.0-25.9, adult: Secondary | ICD-10-CM | POA: Diagnosis not present

## 2021-06-09 DIAGNOSIS — F330 Major depressive disorder, recurrent, mild: Secondary | ICD-10-CM | POA: Diagnosis not present

## 2021-06-09 DIAGNOSIS — E782 Mixed hyperlipidemia: Secondary | ICD-10-CM | POA: Diagnosis not present

## 2021-06-09 DIAGNOSIS — F411 Generalized anxiety disorder: Secondary | ICD-10-CM | POA: Diagnosis not present

## 2021-06-09 DIAGNOSIS — I10 Essential (primary) hypertension: Secondary | ICD-10-CM | POA: Diagnosis not present

## 2021-06-14 DIAGNOSIS — E559 Vitamin D deficiency, unspecified: Secondary | ICD-10-CM | POA: Diagnosis not present

## 2021-06-14 DIAGNOSIS — E611 Iron deficiency: Secondary | ICD-10-CM | POA: Diagnosis not present

## 2021-06-23 DIAGNOSIS — E782 Mixed hyperlipidemia: Secondary | ICD-10-CM | POA: Diagnosis not present

## 2021-06-23 DIAGNOSIS — F411 Generalized anxiety disorder: Secondary | ICD-10-CM | POA: Diagnosis not present

## 2021-06-23 DIAGNOSIS — I10 Essential (primary) hypertension: Secondary | ICD-10-CM | POA: Diagnosis not present

## 2021-06-23 DIAGNOSIS — F330 Major depressive disorder, recurrent, mild: Secondary | ICD-10-CM | POA: Diagnosis not present

## 2021-07-07 DIAGNOSIS — M79605 Pain in left leg: Secondary | ICD-10-CM | POA: Diagnosis not present

## 2021-07-07 DIAGNOSIS — M79604 Pain in right leg: Secondary | ICD-10-CM | POA: Diagnosis not present

## 2021-07-07 DIAGNOSIS — R600 Localized edema: Secondary | ICD-10-CM | POA: Diagnosis not present

## 2021-07-28 DIAGNOSIS — H524 Presbyopia: Secondary | ICD-10-CM | POA: Diagnosis not present

## 2021-07-28 DIAGNOSIS — H40053 Ocular hypertension, bilateral: Secondary | ICD-10-CM | POA: Diagnosis not present

## 2021-08-10 DIAGNOSIS — I87323 Chronic venous hypertension (idiopathic) with inflammation of bilateral lower extremity: Secondary | ICD-10-CM | POA: Diagnosis not present

## 2021-09-02 DIAGNOSIS — E782 Mixed hyperlipidemia: Secondary | ICD-10-CM | POA: Diagnosis not present

## 2021-09-02 DIAGNOSIS — I10 Essential (primary) hypertension: Secondary | ICD-10-CM | POA: Diagnosis not present

## 2021-09-02 DIAGNOSIS — F411 Generalized anxiety disorder: Secondary | ICD-10-CM | POA: Diagnosis not present

## 2021-09-02 DIAGNOSIS — F330 Major depressive disorder, recurrent, mild: Secondary | ICD-10-CM | POA: Diagnosis not present

## 2021-09-02 DIAGNOSIS — Z0000 Encounter for general adult medical examination without abnormal findings: Secondary | ICD-10-CM | POA: Diagnosis not present

## 2021-09-02 DIAGNOSIS — Z6826 Body mass index (BMI) 26.0-26.9, adult: Secondary | ICD-10-CM | POA: Diagnosis not present

## 2021-09-22 DIAGNOSIS — I83891 Varicose veins of right lower extremities with other complications: Secondary | ICD-10-CM | POA: Diagnosis not present

## 2021-09-22 DIAGNOSIS — I872 Venous insufficiency (chronic) (peripheral): Secondary | ICD-10-CM | POA: Diagnosis not present

## 2021-09-28 DIAGNOSIS — Z09 Encounter for follow-up examination after completed treatment for conditions other than malignant neoplasm: Secondary | ICD-10-CM | POA: Diagnosis not present

## 2021-09-28 DIAGNOSIS — I872 Venous insufficiency (chronic) (peripheral): Secondary | ICD-10-CM | POA: Diagnosis not present

## 2021-09-28 DIAGNOSIS — I83811 Varicose veins of right lower extremities with pain: Secondary | ICD-10-CM | POA: Diagnosis not present

## 2021-09-28 DIAGNOSIS — I83892 Varicose veins of left lower extremities with other complications: Secondary | ICD-10-CM | POA: Diagnosis not present

## 2021-10-04 DIAGNOSIS — I83812 Varicose veins of left lower extremities with pain: Secondary | ICD-10-CM | POA: Diagnosis not present

## 2021-10-04 DIAGNOSIS — Z09 Encounter for follow-up examination after completed treatment for conditions other than malignant neoplasm: Secondary | ICD-10-CM | POA: Diagnosis not present

## 2021-10-11 DIAGNOSIS — E611 Iron deficiency: Secondary | ICD-10-CM | POA: Diagnosis not present

## 2021-10-11 DIAGNOSIS — E559 Vitamin D deficiency, unspecified: Secondary | ICD-10-CM | POA: Diagnosis not present

## 2021-10-11 DIAGNOSIS — R6889 Other general symptoms and signs: Secondary | ICD-10-CM | POA: Diagnosis not present

## 2021-10-27 DIAGNOSIS — I872 Venous insufficiency (chronic) (peripheral): Secondary | ICD-10-CM | POA: Diagnosis not present

## 2021-10-27 DIAGNOSIS — I87303 Chronic venous hypertension (idiopathic) without complications of bilateral lower extremity: Secondary | ICD-10-CM | POA: Diagnosis not present

## 2021-11-23 DIAGNOSIS — I10 Essential (primary) hypertension: Secondary | ICD-10-CM | POA: Diagnosis not present

## 2021-11-23 DIAGNOSIS — F330 Major depressive disorder, recurrent, mild: Secondary | ICD-10-CM | POA: Diagnosis not present

## 2021-11-23 DIAGNOSIS — F411 Generalized anxiety disorder: Secondary | ICD-10-CM | POA: Diagnosis not present

## 2021-11-23 DIAGNOSIS — E782 Mixed hyperlipidemia: Secondary | ICD-10-CM | POA: Diagnosis not present

## 2021-12-08 DIAGNOSIS — E782 Mixed hyperlipidemia: Secondary | ICD-10-CM | POA: Diagnosis not present

## 2021-12-08 DIAGNOSIS — Z6826 Body mass index (BMI) 26.0-26.9, adult: Secondary | ICD-10-CM | POA: Diagnosis not present

## 2021-12-08 DIAGNOSIS — I10 Essential (primary) hypertension: Secondary | ICD-10-CM | POA: Diagnosis not present

## 2021-12-08 DIAGNOSIS — F330 Major depressive disorder, recurrent, mild: Secondary | ICD-10-CM | POA: Diagnosis not present

## 2021-12-08 DIAGNOSIS — F411 Generalized anxiety disorder: Secondary | ICD-10-CM | POA: Diagnosis not present

## 2022-02-16 DIAGNOSIS — R6889 Other general symptoms and signs: Secondary | ICD-10-CM | POA: Diagnosis not present

## 2022-02-16 DIAGNOSIS — R5383 Other fatigue: Secondary | ICD-10-CM | POA: Diagnosis not present

## 2022-02-16 DIAGNOSIS — E559 Vitamin D deficiency, unspecified: Secondary | ICD-10-CM | POA: Diagnosis not present

## 2022-02-16 DIAGNOSIS — E611 Iron deficiency: Secondary | ICD-10-CM | POA: Diagnosis not present

## 2022-03-09 DIAGNOSIS — Z6826 Body mass index (BMI) 26.0-26.9, adult: Secondary | ICD-10-CM | POA: Diagnosis not present

## 2022-03-09 DIAGNOSIS — I10 Essential (primary) hypertension: Secondary | ICD-10-CM | POA: Diagnosis not present

## 2022-03-09 DIAGNOSIS — F330 Major depressive disorder, recurrent, mild: Secondary | ICD-10-CM | POA: Diagnosis not present

## 2022-03-09 DIAGNOSIS — F411 Generalized anxiety disorder: Secondary | ICD-10-CM | POA: Diagnosis not present

## 2022-03-09 DIAGNOSIS — E782 Mixed hyperlipidemia: Secondary | ICD-10-CM | POA: Diagnosis not present

## 2022-03-09 DIAGNOSIS — Z0000 Encounter for general adult medical examination without abnormal findings: Secondary | ICD-10-CM | POA: Diagnosis not present

## 2022-03-14 DIAGNOSIS — H40013 Open angle with borderline findings, low risk, bilateral: Secondary | ICD-10-CM | POA: Diagnosis not present

## 2022-03-14 DIAGNOSIS — H524 Presbyopia: Secondary | ICD-10-CM | POA: Diagnosis not present

## 2022-05-25 DIAGNOSIS — E611 Iron deficiency: Secondary | ICD-10-CM | POA: Diagnosis not present

## 2022-05-25 DIAGNOSIS — R6889 Other general symptoms and signs: Secondary | ICD-10-CM | POA: Diagnosis not present

## 2022-05-25 DIAGNOSIS — E559 Vitamin D deficiency, unspecified: Secondary | ICD-10-CM | POA: Diagnosis not present

## 2022-05-31 DIAGNOSIS — I87303 Chronic venous hypertension (idiopathic) without complications of bilateral lower extremity: Secondary | ICD-10-CM | POA: Diagnosis not present

## 2022-05-31 DIAGNOSIS — I87393 Chronic venous hypertension (idiopathic) with other complications of bilateral lower extremity: Secondary | ICD-10-CM | POA: Diagnosis not present

## 2022-06-13 DIAGNOSIS — I10 Essential (primary) hypertension: Secondary | ICD-10-CM | POA: Diagnosis not present

## 2022-06-13 DIAGNOSIS — E782 Mixed hyperlipidemia: Secondary | ICD-10-CM | POA: Diagnosis not present

## 2022-06-13 DIAGNOSIS — F330 Major depressive disorder, recurrent, mild: Secondary | ICD-10-CM | POA: Diagnosis not present

## 2022-06-13 DIAGNOSIS — Z6825 Body mass index (BMI) 25.0-25.9, adult: Secondary | ICD-10-CM | POA: Diagnosis not present

## 2022-06-13 DIAGNOSIS — F411 Generalized anxiety disorder: Secondary | ICD-10-CM | POA: Diagnosis not present

## 2022-08-30 DIAGNOSIS — H40013 Open angle with borderline findings, low risk, bilateral: Secondary | ICD-10-CM | POA: Diagnosis not present

## 2022-09-20 DIAGNOSIS — E782 Mixed hyperlipidemia: Secondary | ICD-10-CM | POA: Diagnosis not present

## 2022-09-20 DIAGNOSIS — Z6825 Body mass index (BMI) 25.0-25.9, adult: Secondary | ICD-10-CM | POA: Diagnosis not present

## 2022-09-20 DIAGNOSIS — F330 Major depressive disorder, recurrent, mild: Secondary | ICD-10-CM | POA: Diagnosis not present

## 2022-09-20 DIAGNOSIS — I10 Essential (primary) hypertension: Secondary | ICD-10-CM | POA: Diagnosis not present

## 2022-09-20 DIAGNOSIS — F411 Generalized anxiety disorder: Secondary | ICD-10-CM | POA: Diagnosis not present

## 2022-12-26 DIAGNOSIS — Z0000 Encounter for general adult medical examination without abnormal findings: Secondary | ICD-10-CM | POA: Diagnosis not present

## 2022-12-26 DIAGNOSIS — F330 Major depressive disorder, recurrent, mild: Secondary | ICD-10-CM | POA: Diagnosis not present

## 2022-12-26 DIAGNOSIS — M150 Primary generalized (osteo)arthritis: Secondary | ICD-10-CM | POA: Diagnosis not present

## 2022-12-26 DIAGNOSIS — E782 Mixed hyperlipidemia: Secondary | ICD-10-CM | POA: Diagnosis not present

## 2022-12-26 DIAGNOSIS — F411 Generalized anxiety disorder: Secondary | ICD-10-CM | POA: Diagnosis not present

## 2022-12-26 DIAGNOSIS — I10 Essential (primary) hypertension: Secondary | ICD-10-CM | POA: Diagnosis not present

## 2022-12-26 DIAGNOSIS — I872 Venous insufficiency (chronic) (peripheral): Secondary | ICD-10-CM | POA: Diagnosis not present

## 2022-12-26 DIAGNOSIS — Z6825 Body mass index (BMI) 25.0-25.9, adult: Secondary | ICD-10-CM | POA: Diagnosis not present

## 2023-03-28 DIAGNOSIS — M150 Primary generalized (osteo)arthritis: Secondary | ICD-10-CM | POA: Diagnosis not present

## 2023-03-28 DIAGNOSIS — I872 Venous insufficiency (chronic) (peripheral): Secondary | ICD-10-CM | POA: Diagnosis not present

## 2023-03-28 DIAGNOSIS — F330 Major depressive disorder, recurrent, mild: Secondary | ICD-10-CM | POA: Diagnosis not present

## 2023-03-28 DIAGNOSIS — F411 Generalized anxiety disorder: Secondary | ICD-10-CM | POA: Diagnosis not present

## 2023-03-28 DIAGNOSIS — Z0000 Encounter for general adult medical examination without abnormal findings: Secondary | ICD-10-CM | POA: Diagnosis not present

## 2023-03-28 DIAGNOSIS — I10 Essential (primary) hypertension: Secondary | ICD-10-CM | POA: Diagnosis not present

## 2023-03-28 DIAGNOSIS — Z6824 Body mass index (BMI) 24.0-24.9, adult: Secondary | ICD-10-CM | POA: Diagnosis not present

## 2023-03-28 DIAGNOSIS — E782 Mixed hyperlipidemia: Secondary | ICD-10-CM | POA: Diagnosis not present

## 2023-08-09 DIAGNOSIS — N1831 Chronic kidney disease, stage 3a: Secondary | ICD-10-CM | POA: Diagnosis not present

## 2023-08-09 DIAGNOSIS — F330 Major depressive disorder, recurrent, mild: Secondary | ICD-10-CM | POA: Diagnosis not present

## 2023-08-09 DIAGNOSIS — F411 Generalized anxiety disorder: Secondary | ICD-10-CM | POA: Diagnosis not present

## 2023-08-09 DIAGNOSIS — E782 Mixed hyperlipidemia: Secondary | ICD-10-CM | POA: Diagnosis not present

## 2023-08-09 DIAGNOSIS — I10 Essential (primary) hypertension: Secondary | ICD-10-CM | POA: Diagnosis not present

## 2023-08-09 DIAGNOSIS — I872 Venous insufficiency (chronic) (peripheral): Secondary | ICD-10-CM | POA: Diagnosis not present

## 2023-08-09 DIAGNOSIS — M150 Primary generalized (osteo)arthritis: Secondary | ICD-10-CM | POA: Diagnosis not present

## 2023-08-09 DIAGNOSIS — Z6824 Body mass index (BMI) 24.0-24.9, adult: Secondary | ICD-10-CM | POA: Diagnosis not present

## 2023-11-08 DIAGNOSIS — M51369 Other intervertebral disc degeneration, lumbar region without mention of lumbar back pain or lower extremity pain: Secondary | ICD-10-CM | POA: Diagnosis not present

## 2023-11-08 DIAGNOSIS — M47814 Spondylosis without myelopathy or radiculopathy, thoracic region: Secondary | ICD-10-CM | POA: Diagnosis not present

## 2023-11-08 DIAGNOSIS — M4185 Other forms of scoliosis, thoracolumbar region: Secondary | ICD-10-CM | POA: Diagnosis not present

## 2023-11-08 DIAGNOSIS — M549 Dorsalgia, unspecified: Secondary | ICD-10-CM | POA: Diagnosis not present

## 2023-11-08 DIAGNOSIS — R102 Pelvic and perineal pain: Secondary | ICD-10-CM | POA: Diagnosis not present

## 2023-11-08 DIAGNOSIS — M5134 Other intervertebral disc degeneration, thoracic region: Secondary | ICD-10-CM | POA: Diagnosis not present

## 2023-11-08 DIAGNOSIS — M47816 Spondylosis without myelopathy or radiculopathy, lumbar region: Secondary | ICD-10-CM | POA: Diagnosis not present

## 2023-11-08 DIAGNOSIS — M160 Bilateral primary osteoarthritis of hip: Secondary | ICD-10-CM | POA: Diagnosis not present

## 2023-11-08 DIAGNOSIS — M419 Scoliosis, unspecified: Secondary | ICD-10-CM | POA: Diagnosis not present

## 2023-11-08 DIAGNOSIS — M438X6 Other specified deforming dorsopathies, lumbar region: Secondary | ICD-10-CM | POA: Diagnosis not present

## 2023-11-08 DIAGNOSIS — M546 Pain in thoracic spine: Secondary | ICD-10-CM | POA: Diagnosis not present

## 2023-11-09 DIAGNOSIS — M170 Bilateral primary osteoarthritis of knee: Secondary | ICD-10-CM | POA: Diagnosis not present

## 2023-11-09 DIAGNOSIS — F330 Major depressive disorder, recurrent, mild: Secondary | ICD-10-CM | POA: Diagnosis not present

## 2023-11-09 DIAGNOSIS — F411 Generalized anxiety disorder: Secondary | ICD-10-CM | POA: Diagnosis not present

## 2023-11-09 DIAGNOSIS — M25561 Pain in right knee: Secondary | ICD-10-CM | POA: Diagnosis not present

## 2023-11-09 DIAGNOSIS — N1831 Chronic kidney disease, stage 3a: Secondary | ICD-10-CM | POA: Diagnosis not present

## 2023-11-09 DIAGNOSIS — M25562 Pain in left knee: Secondary | ICD-10-CM | POA: Diagnosis not present

## 2023-11-09 DIAGNOSIS — M150 Primary generalized (osteo)arthritis: Secondary | ICD-10-CM | POA: Diagnosis not present

## 2023-11-09 DIAGNOSIS — M2392 Unspecified internal derangement of left knee: Secondary | ICD-10-CM | POA: Diagnosis not present

## 2023-11-09 DIAGNOSIS — I872 Venous insufficiency (chronic) (peripheral): Secondary | ICD-10-CM | POA: Diagnosis not present

## 2023-11-09 DIAGNOSIS — Z6824 Body mass index (BMI) 24.0-24.9, adult: Secondary | ICD-10-CM | POA: Diagnosis not present

## 2023-11-09 DIAGNOSIS — I10 Essential (primary) hypertension: Secondary | ICD-10-CM | POA: Diagnosis not present

## 2023-11-09 DIAGNOSIS — E782 Mixed hyperlipidemia: Secondary | ICD-10-CM | POA: Diagnosis not present

## 2023-11-16 DIAGNOSIS — F330 Major depressive disorder, recurrent, mild: Secondary | ICD-10-CM | POA: Diagnosis not present

## 2023-11-16 DIAGNOSIS — M2392 Unspecified internal derangement of left knee: Secondary | ICD-10-CM | POA: Diagnosis not present

## 2023-11-16 DIAGNOSIS — F411 Generalized anxiety disorder: Secondary | ICD-10-CM | POA: Diagnosis not present

## 2023-11-16 DIAGNOSIS — M150 Primary generalized (osteo)arthritis: Secondary | ICD-10-CM | POA: Diagnosis not present

## 2023-11-16 DIAGNOSIS — I872 Venous insufficiency (chronic) (peripheral): Secondary | ICD-10-CM | POA: Diagnosis not present

## 2023-11-16 DIAGNOSIS — I129 Hypertensive chronic kidney disease with stage 1 through stage 4 chronic kidney disease, or unspecified chronic kidney disease: Secondary | ICD-10-CM | POA: Diagnosis not present

## 2023-11-16 DIAGNOSIS — N1831 Chronic kidney disease, stage 3a: Secondary | ICD-10-CM | POA: Diagnosis not present

## 2023-11-23 DIAGNOSIS — F330 Major depressive disorder, recurrent, mild: Secondary | ICD-10-CM | POA: Diagnosis not present

## 2023-11-23 DIAGNOSIS — M2392 Unspecified internal derangement of left knee: Secondary | ICD-10-CM | POA: Diagnosis not present

## 2023-11-23 DIAGNOSIS — N1831 Chronic kidney disease, stage 3a: Secondary | ICD-10-CM | POA: Diagnosis not present

## 2023-11-23 DIAGNOSIS — M150 Primary generalized (osteo)arthritis: Secondary | ICD-10-CM | POA: Diagnosis not present

## 2023-11-23 DIAGNOSIS — I872 Venous insufficiency (chronic) (peripheral): Secondary | ICD-10-CM | POA: Diagnosis not present

## 2023-11-23 DIAGNOSIS — I129 Hypertensive chronic kidney disease with stage 1 through stage 4 chronic kidney disease, or unspecified chronic kidney disease: Secondary | ICD-10-CM | POA: Diagnosis not present

## 2023-12-18 DIAGNOSIS — I83893 Varicose veins of bilateral lower extremities with other complications: Secondary | ICD-10-CM | POA: Diagnosis not present

## 2023-12-18 DIAGNOSIS — I872 Venous insufficiency (chronic) (peripheral): Secondary | ICD-10-CM | POA: Diagnosis not present

## 2023-12-26 DIAGNOSIS — M79604 Pain in right leg: Secondary | ICD-10-CM | POA: Diagnosis not present

## 2023-12-26 DIAGNOSIS — M545 Low back pain, unspecified: Secondary | ICD-10-CM | POA: Diagnosis not present

## 2023-12-26 DIAGNOSIS — M79605 Pain in left leg: Secondary | ICD-10-CM | POA: Diagnosis not present

## 2023-12-26 DIAGNOSIS — R202 Paresthesia of skin: Secondary | ICD-10-CM | POA: Diagnosis not present

## 2023-12-26 DIAGNOSIS — R200 Anesthesia of skin: Secondary | ICD-10-CM | POA: Diagnosis not present

## 2023-12-26 DIAGNOSIS — R29898 Other symptoms and signs involving the musculoskeletal system: Secondary | ICD-10-CM | POA: Diagnosis not present

## 2023-12-28 DIAGNOSIS — R202 Paresthesia of skin: Secondary | ICD-10-CM | POA: Diagnosis not present

## 2023-12-28 DIAGNOSIS — M545 Low back pain, unspecified: Secondary | ICD-10-CM | POA: Diagnosis not present

## 2023-12-28 DIAGNOSIS — R29898 Other symptoms and signs involving the musculoskeletal system: Secondary | ICD-10-CM | POA: Diagnosis not present

## 2023-12-28 DIAGNOSIS — R200 Anesthesia of skin: Secondary | ICD-10-CM | POA: Diagnosis not present

## 2024-01-01 DIAGNOSIS — M545 Low back pain, unspecified: Secondary | ICD-10-CM | POA: Diagnosis not present

## 2024-01-01 DIAGNOSIS — R202 Paresthesia of skin: Secondary | ICD-10-CM | POA: Diagnosis not present

## 2024-01-01 DIAGNOSIS — R29898 Other symptoms and signs involving the musculoskeletal system: Secondary | ICD-10-CM | POA: Diagnosis not present

## 2024-01-01 DIAGNOSIS — R200 Anesthesia of skin: Secondary | ICD-10-CM | POA: Diagnosis not present

## 2024-01-03 DIAGNOSIS — R202 Paresthesia of skin: Secondary | ICD-10-CM | POA: Diagnosis not present

## 2024-01-03 DIAGNOSIS — R200 Anesthesia of skin: Secondary | ICD-10-CM | POA: Diagnosis not present

## 2024-01-03 DIAGNOSIS — R29898 Other symptoms and signs involving the musculoskeletal system: Secondary | ICD-10-CM | POA: Diagnosis not present

## 2024-01-03 DIAGNOSIS — M545 Low back pain, unspecified: Secondary | ICD-10-CM | POA: Diagnosis not present

## 2024-01-08 DIAGNOSIS — M545 Low back pain, unspecified: Secondary | ICD-10-CM | POA: Diagnosis not present

## 2024-01-08 DIAGNOSIS — R29898 Other symptoms and signs involving the musculoskeletal system: Secondary | ICD-10-CM | POA: Diagnosis not present

## 2024-01-08 DIAGNOSIS — R202 Paresthesia of skin: Secondary | ICD-10-CM | POA: Diagnosis not present

## 2024-01-08 DIAGNOSIS — R200 Anesthesia of skin: Secondary | ICD-10-CM | POA: Diagnosis not present

## 2024-01-10 DIAGNOSIS — R200 Anesthesia of skin: Secondary | ICD-10-CM | POA: Diagnosis not present

## 2024-01-10 DIAGNOSIS — R29898 Other symptoms and signs involving the musculoskeletal system: Secondary | ICD-10-CM | POA: Diagnosis not present

## 2024-01-10 DIAGNOSIS — M545 Low back pain, unspecified: Secondary | ICD-10-CM | POA: Diagnosis not present

## 2024-01-10 DIAGNOSIS — R202 Paresthesia of skin: Secondary | ICD-10-CM | POA: Diagnosis not present

## 2024-01-17 DIAGNOSIS — M545 Low back pain, unspecified: Secondary | ICD-10-CM | POA: Diagnosis not present

## 2024-01-17 DIAGNOSIS — R200 Anesthesia of skin: Secondary | ICD-10-CM | POA: Diagnosis not present

## 2024-01-17 DIAGNOSIS — R202 Paresthesia of skin: Secondary | ICD-10-CM | POA: Diagnosis not present

## 2024-01-17 DIAGNOSIS — R29898 Other symptoms and signs involving the musculoskeletal system: Secondary | ICD-10-CM | POA: Diagnosis not present

## 2024-01-19 DIAGNOSIS — R200 Anesthesia of skin: Secondary | ICD-10-CM | POA: Diagnosis not present

## 2024-01-19 DIAGNOSIS — R202 Paresthesia of skin: Secondary | ICD-10-CM | POA: Diagnosis not present

## 2024-01-19 DIAGNOSIS — R29898 Other symptoms and signs involving the musculoskeletal system: Secondary | ICD-10-CM | POA: Diagnosis not present

## 2024-01-19 DIAGNOSIS — M545 Low back pain, unspecified: Secondary | ICD-10-CM | POA: Diagnosis not present

## 2024-01-22 DIAGNOSIS — M545 Low back pain, unspecified: Secondary | ICD-10-CM | POA: Diagnosis not present

## 2024-01-22 DIAGNOSIS — R29898 Other symptoms and signs involving the musculoskeletal system: Secondary | ICD-10-CM | POA: Diagnosis not present

## 2024-01-22 DIAGNOSIS — R202 Paresthesia of skin: Secondary | ICD-10-CM | POA: Diagnosis not present

## 2024-01-22 DIAGNOSIS — R200 Anesthesia of skin: Secondary | ICD-10-CM | POA: Diagnosis not present

## 2024-01-23 DIAGNOSIS — M545 Low back pain, unspecified: Secondary | ICD-10-CM | POA: Diagnosis not present

## 2024-01-24 DIAGNOSIS — M545 Low back pain, unspecified: Secondary | ICD-10-CM | POA: Diagnosis not present

## 2024-01-24 DIAGNOSIS — R29898 Other symptoms and signs involving the musculoskeletal system: Secondary | ICD-10-CM | POA: Diagnosis not present

## 2024-01-24 DIAGNOSIS — R200 Anesthesia of skin: Secondary | ICD-10-CM | POA: Diagnosis not present

## 2024-01-24 DIAGNOSIS — R202 Paresthesia of skin: Secondary | ICD-10-CM | POA: Diagnosis not present

## 2024-01-29 DIAGNOSIS — M48061 Spinal stenosis, lumbar region without neurogenic claudication: Secondary | ICD-10-CM | POA: Diagnosis not present

## 2024-01-29 DIAGNOSIS — M79604 Pain in right leg: Secondary | ICD-10-CM | POA: Diagnosis not present

## 2024-01-29 DIAGNOSIS — M545 Low back pain, unspecified: Secondary | ICD-10-CM | POA: Diagnosis not present

## 2024-01-29 DIAGNOSIS — R202 Paresthesia of skin: Secondary | ICD-10-CM | POA: Diagnosis not present

## 2024-01-29 DIAGNOSIS — R29898 Other symptoms and signs involving the musculoskeletal system: Secondary | ICD-10-CM | POA: Diagnosis not present

## 2024-01-29 DIAGNOSIS — M79605 Pain in left leg: Secondary | ICD-10-CM | POA: Diagnosis not present

## 2024-01-29 DIAGNOSIS — R200 Anesthesia of skin: Secondary | ICD-10-CM | POA: Diagnosis not present

## 2024-01-31 DIAGNOSIS — M549 Dorsalgia, unspecified: Secondary | ICD-10-CM | POA: Diagnosis not present

## 2024-01-31 DIAGNOSIS — M545 Low back pain, unspecified: Secondary | ICD-10-CM | POA: Diagnosis not present

## 2024-01-31 DIAGNOSIS — G8929 Other chronic pain: Secondary | ICD-10-CM | POA: Diagnosis not present

## 2024-01-31 DIAGNOSIS — M79604 Pain in right leg: Secondary | ICD-10-CM | POA: Diagnosis not present

## 2024-01-31 DIAGNOSIS — M79605 Pain in left leg: Secondary | ICD-10-CM | POA: Diagnosis not present

## 2024-01-31 DIAGNOSIS — M7918 Myalgia, other site: Secondary | ICD-10-CM | POA: Diagnosis not present

## 2024-02-16 DIAGNOSIS — E782 Mixed hyperlipidemia: Secondary | ICD-10-CM | POA: Diagnosis not present

## 2024-02-16 DIAGNOSIS — M2392 Unspecified internal derangement of left knee: Secondary | ICD-10-CM | POA: Diagnosis not present

## 2024-02-16 DIAGNOSIS — N1831 Chronic kidney disease, stage 3a: Secondary | ICD-10-CM | POA: Diagnosis not present

## 2024-02-16 DIAGNOSIS — I872 Venous insufficiency (chronic) (peripheral): Secondary | ICD-10-CM | POA: Diagnosis not present

## 2024-02-16 DIAGNOSIS — F330 Major depressive disorder, recurrent, mild: Secondary | ICD-10-CM | POA: Diagnosis not present

## 2024-02-16 DIAGNOSIS — F411 Generalized anxiety disorder: Secondary | ICD-10-CM | POA: Diagnosis not present

## 2024-02-16 DIAGNOSIS — Z6824 Body mass index (BMI) 24.0-24.9, adult: Secondary | ICD-10-CM | POA: Diagnosis not present

## 2024-02-16 DIAGNOSIS — M150 Primary generalized (osteo)arthritis: Secondary | ICD-10-CM | POA: Diagnosis not present

## 2024-02-16 DIAGNOSIS — I10 Essential (primary) hypertension: Secondary | ICD-10-CM | POA: Diagnosis not present

## 2024-02-22 DIAGNOSIS — M5416 Radiculopathy, lumbar region: Secondary | ICD-10-CM | POA: Diagnosis not present

## 2024-03-08 DIAGNOSIS — M549 Dorsalgia, unspecified: Secondary | ICD-10-CM | POA: Diagnosis not present

## 2024-03-08 DIAGNOSIS — G8929 Other chronic pain: Secondary | ICD-10-CM | POA: Diagnosis not present

## 2024-03-08 DIAGNOSIS — M545 Low back pain, unspecified: Secondary | ICD-10-CM | POA: Diagnosis not present

## 2024-03-08 DIAGNOSIS — M5416 Radiculopathy, lumbar region: Secondary | ICD-10-CM | POA: Diagnosis not present

## 2024-03-08 DIAGNOSIS — M7918 Myalgia, other site: Secondary | ICD-10-CM | POA: Diagnosis not present

## 2024-03-08 DIAGNOSIS — M79605 Pain in left leg: Secondary | ICD-10-CM | POA: Diagnosis not present

## 2024-03-08 DIAGNOSIS — M79604 Pain in right leg: Secondary | ICD-10-CM | POA: Diagnosis not present

## 2024-03-14 DIAGNOSIS — M5416 Radiculopathy, lumbar region: Secondary | ICD-10-CM | POA: Diagnosis not present

## 2024-03-19 DIAGNOSIS — M5416 Radiculopathy, lumbar region: Secondary | ICD-10-CM | POA: Diagnosis not present

## 2024-03-21 DIAGNOSIS — M5416 Radiculopathy, lumbar region: Secondary | ICD-10-CM | POA: Diagnosis not present

## 2024-03-26 DIAGNOSIS — M5416 Radiculopathy, lumbar region: Secondary | ICD-10-CM | POA: Diagnosis not present

## 2024-03-28 DIAGNOSIS — M5416 Radiculopathy, lumbar region: Secondary | ICD-10-CM | POA: Diagnosis not present

## 2024-04-02 DIAGNOSIS — M5416 Radiculopathy, lumbar region: Secondary | ICD-10-CM | POA: Diagnosis not present

## 2024-04-04 DIAGNOSIS — M5416 Radiculopathy, lumbar region: Secondary | ICD-10-CM | POA: Diagnosis not present

## 2024-04-05 DIAGNOSIS — M5416 Radiculopathy, lumbar region: Secondary | ICD-10-CM | POA: Diagnosis not present

## 2024-04-09 DIAGNOSIS — M5416 Radiculopathy, lumbar region: Secondary | ICD-10-CM | POA: Diagnosis not present

## 2024-04-16 DIAGNOSIS — M5416 Radiculopathy, lumbar region: Secondary | ICD-10-CM | POA: Diagnosis not present

## 2024-04-18 DIAGNOSIS — M5416 Radiculopathy, lumbar region: Secondary | ICD-10-CM | POA: Diagnosis not present

## 2024-05-20 DIAGNOSIS — G894 Chronic pain syndrome: Secondary | ICD-10-CM | POA: Diagnosis not present

## 2024-05-20 DIAGNOSIS — M5416 Radiculopathy, lumbar region: Secondary | ICD-10-CM | POA: Diagnosis not present

## 2024-05-20 DIAGNOSIS — M549 Dorsalgia, unspecified: Secondary | ICD-10-CM | POA: Diagnosis not present

## 2024-05-20 DIAGNOSIS — M7918 Myalgia, other site: Secondary | ICD-10-CM | POA: Diagnosis not present

## 2024-05-22 DIAGNOSIS — Z6824 Body mass index (BMI) 24.0-24.9, adult: Secondary | ICD-10-CM | POA: Diagnosis not present

## 2024-05-22 DIAGNOSIS — M150 Primary generalized (osteo)arthritis: Secondary | ICD-10-CM | POA: Diagnosis not present

## 2024-05-22 DIAGNOSIS — N1831 Chronic kidney disease, stage 3a: Secondary | ICD-10-CM | POA: Diagnosis not present

## 2024-05-22 DIAGNOSIS — I10 Essential (primary) hypertension: Secondary | ICD-10-CM | POA: Diagnosis not present

## 2024-05-22 DIAGNOSIS — I872 Venous insufficiency (chronic) (peripheral): Secondary | ICD-10-CM | POA: Diagnosis not present

## 2024-05-22 DIAGNOSIS — M2392 Unspecified internal derangement of left knee: Secondary | ICD-10-CM | POA: Diagnosis not present

## 2024-05-22 DIAGNOSIS — F411 Generalized anxiety disorder: Secondary | ICD-10-CM | POA: Diagnosis not present

## 2024-05-22 DIAGNOSIS — E782 Mixed hyperlipidemia: Secondary | ICD-10-CM | POA: Diagnosis not present

## 2024-05-22 DIAGNOSIS — F330 Major depressive disorder, recurrent, mild: Secondary | ICD-10-CM | POA: Diagnosis not present

## 2024-06-12 DIAGNOSIS — M5416 Radiculopathy, lumbar region: Secondary | ICD-10-CM | POA: Diagnosis not present

## 2024-06-18 DIAGNOSIS — M810 Age-related osteoporosis without current pathological fracture: Secondary | ICD-10-CM | POA: Diagnosis not present

## 2024-06-18 DIAGNOSIS — Z780 Asymptomatic menopausal state: Secondary | ICD-10-CM | POA: Diagnosis not present

## 2024-07-03 DIAGNOSIS — M5416 Radiculopathy, lumbar region: Secondary | ICD-10-CM | POA: Diagnosis not present

## 2024-07-03 DIAGNOSIS — G894 Chronic pain syndrome: Secondary | ICD-10-CM | POA: Diagnosis not present

## 2024-07-03 DIAGNOSIS — M7918 Myalgia, other site: Secondary | ICD-10-CM | POA: Diagnosis not present

## 2024-07-03 DIAGNOSIS — M549 Dorsalgia, unspecified: Secondary | ICD-10-CM | POA: Diagnosis not present

## 2024-07-25 DIAGNOSIS — M545 Low back pain, unspecified: Secondary | ICD-10-CM | POA: Diagnosis not present

## 2024-09-03 DIAGNOSIS — M549 Dorsalgia, unspecified: Secondary | ICD-10-CM | POA: Diagnosis not present

## 2024-09-03 DIAGNOSIS — M7918 Myalgia, other site: Secondary | ICD-10-CM | POA: Diagnosis not present

## 2024-09-03 DIAGNOSIS — M5416 Radiculopathy, lumbar region: Secondary | ICD-10-CM | POA: Diagnosis not present

## 2024-09-03 DIAGNOSIS — G894 Chronic pain syndrome: Secondary | ICD-10-CM | POA: Diagnosis not present

## 2024-09-25 DIAGNOSIS — I10 Essential (primary) hypertension: Secondary | ICD-10-CM | POA: Diagnosis not present

## 2024-09-25 DIAGNOSIS — M150 Primary generalized (osteo)arthritis: Secondary | ICD-10-CM | POA: Diagnosis not present

## 2024-09-25 DIAGNOSIS — M2392 Unspecified internal derangement of left knee: Secondary | ICD-10-CM | POA: Diagnosis not present

## 2024-09-25 DIAGNOSIS — E782 Mixed hyperlipidemia: Secondary | ICD-10-CM | POA: Diagnosis not present

## 2024-09-25 DIAGNOSIS — N182 Chronic kidney disease, stage 2 (mild): Secondary | ICD-10-CM | POA: Diagnosis not present

## 2024-09-25 DIAGNOSIS — F330 Major depressive disorder, recurrent, mild: Secondary | ICD-10-CM | POA: Diagnosis not present

## 2024-09-25 DIAGNOSIS — F411 Generalized anxiety disorder: Secondary | ICD-10-CM | POA: Diagnosis not present

## 2024-09-25 DIAGNOSIS — I872 Venous insufficiency (chronic) (peripheral): Secondary | ICD-10-CM | POA: Diagnosis not present

## 2024-09-25 DIAGNOSIS — Z6823 Body mass index (BMI) 23.0-23.9, adult: Secondary | ICD-10-CM | POA: Diagnosis not present
